# Patient Record
Sex: Male | Born: 1985 | Race: Black or African American | Hispanic: No | Marital: Single | Smoking: Never smoker
Health system: Southern US, Community
[De-identification: ages and names within clinical notes are randomized; demographics above are authoritative.]

## PROBLEM LIST (undated history)

## (undated) HISTORY — PX: HERNIA REPAIR: SHX51

---

## 2007-03-18 ENCOUNTER — Emergency Department (HOSPITAL_COMMUNITY): Admission: EM | Admit: 2007-03-18 | Discharge: 2007-03-18 | Payer: Self-pay | Admitting: Emergency Medicine

## 2007-11-20 ENCOUNTER — Ambulatory Visit (HOSPITAL_COMMUNITY): Admission: RE | Admit: 2007-11-20 | Discharge: 2007-11-20 | Payer: Self-pay | Admitting: General Surgery

## 2010-04-11 ENCOUNTER — Emergency Department (HOSPITAL_COMMUNITY): Admission: EM | Admit: 2010-04-11 | Discharge: 2010-04-11 | Payer: Self-pay | Admitting: Emergency Medicine

## 2011-04-05 NOTE — Op Note (Signed)
NAME:  Jorge Banks, Jorge Banks             ACCOUNT NO.:  [id number]   MEDICAL RECORD NO.:  [id number]          PATIENT TYPE:  AMB   LOCATION:  DAY                          FACILITY:  [facility]   PHYSICIAN:  Angelia Mould. Derrell Lolling, M.D.DATE OF BIRTH:  [birth year]   DATE OF PROCEDURE:  11/20/2007  DATE OF DISCHARGE:                               OPERATIVE REPORT   PREOPERATIVE DIAGNOSIS:  Bilateral inguinal hernias.   POSTOPERATIVE DIAGNOSIS:  Bilateral inguinal hernias.   OPERATION PERFORMED:  Laparoscopic, preperitoneal repair of bilateral  inguinal hernias with mesh.   SURGEON:  Dr. Claud Kelp   OPERATIVE INDICATIONS:  This is a 26 year old black man who has  developed a bulge and some pain in his right groin for several months.  On exam, he has a large right inguinal hernia that partially extends  into the scrotum.  On exam he also has a smaller left inguinal hernia  that is basically asymptomatic.  He was offered repair of both hernias  and he elected to do that.  We discussed options for intervention and we  decided to go ahead with a laparoscopic approach.   OPERATIVE FINDINGS:  The patient had a very large, indirect right  inguinal hernia but I was able to ultimately completely dissect the  indirect sac completely back and away.  On the left side, he had a  smaller indirect hernia that was more easily dissected.  On the right  side, the internal inguinal ring seemed to be enlarged and on the left  side it did not.   OPERATIVE TECHNIQUE:  Following induction of general endotracheal  anesthesia, the patient's abdomen was prepped and draped in a sterile  fashion.  The patient was identified as the correct patient and the  correct procedure.  Intravenous antibiotics were given.  Then, 0.5%  Marcaine with epinephrine was used as a local infiltration anesthetic.  A curved transverse incision was made at the lower rim of the umbilicus.  The fascia was incised transversely exposing the medial  margin of the  right rectus muscle.  A dissector balloon was placed in the right rectus  sheath in the midline behind the right rectus muscle.  The camera was  inserted.  The dissector balloon was inflated under direct vision with  air and held in place for about 4 or 5 minutes.   Deployment of the balloon was fairly good.  We removed the dissector  balloon.  We secured the trocar following inflation of the trocar  balloon and insufflated 12 mmHg.  We inserted the video camera.  We  found that we had a hole in the peritoneum high on the left side and the  patient had a pneumoperitoneum.  We placed a Veress needle in the right  upper quadrant to decompress that somewhat.  We placed a 5-mm trocar in  the midline just below the video camera, and ultimately after dissecting  we had a trocar in the left lower quadrant and the right lower quadrant,  both 5 mm.   We turned our attention to the right side first.  We noticed that there  was  a huge indirect hernia.  We began to dissect this away.  We  ultimately had to divide the inferior epigastric vessels because they  were stripped down and away and intimately associated with the hernia  sac.  Ultimately, I was able to get around the lateral aspect of the  indirect hernia sac and encircle and get behind the indirect hernia sac  and the vas deferens and the cord structures.  It took some time but I  slowly dissected the indirect sac away from the cord structures and out  of the inguinal canal until I had the sac completely freed up and to  where I could pull it well back and above the level of the anterior  superior iliac spine.  I checked the anatomy and could see the vas  deferens and the cord structures and the internal ring.   I then cleaned off Cooper's ligaments on both sides and the pubic  tubercle a little bit.  I then turned my attention to the left side  where I cleaned off the peritoneum laterally and then from lateral to  medially  isolated and encircled the cord structures.  I dissected the  indirect hernia sac on the left side back easily to where it was well  above the level of the anterior superior iliac spine.  I could see that  there was nothing left but the vas deferens and the testicular vessels.  There was no evidence of direct hernia on either side.   The hernias were repaired with Bard brand 3-D Max mesh.  I repaired the  right side first.  I slit the mesh laterally so as to wrap around the  cord structures and then inserted the mesh.  I positioned the mesh so  that it would overlap the midline a little bit, overlap the Cooper's  ligament inferiorly a little bit, and then the inferior tail of the mesh  was passed behind the cord structures and everything was positioned.  I  secured the mesh to this midline and the superior rim of Cooper's  ligaments with about four 5 mm screw tacks.  The mesh was likewise  secured up the midline across the posterior belly of the rectus muscle.  Laterally, I overlapped the tails of the mesh.  I then secured the mesh  laterally with 5 mm screw tacker.  Laterally, I made sure that I could  palpate the tacker through the abdominal wall to avoid fixation below  the inguinal ligament and iliopubic tract.   On the left side, I used a large piece of Bard 3-D Max mesh.  On the  left side, I did not slit the mesh laterally but simply inserted it and  positioned and tacked it in place.  I overlapped the midline a little  bit.  I tacked it down on Cooper's ligament with about three screw  tacks, tacked it up the midline and across the posterior belly of the  rectus muscle.  Laterally, I secured the mesh in a similar fashion above  the level of the iliopubic tract by palpation of the tacker through the  abdominal wall.   I examined the repairs.  Both repairs appeared secure and well fixed.  There was no bleeding.  I irrigated out the field a little bit.  Everything looked fine.   The trocars were removed under direct vision.  There was no bleeding from the trocar sites.  Pneumoperitoneum was  released.  The fascia at the umbilicus was  closed with two interrupted  figure-of-eight sutures of 0 Vicryl.  Skin incisions were closed with  subcuticular sutures of 4-0 Monocryl and Dermabond.  Clean bandages were  placed and the patient taken to the recovery room in stable condition.  Estimated blood loss was about 28 mL.  Complications none.  Sponge,  needle and instrument counts were correct.      Angelia Mould. Derrell Lolling, M.D.  Electronically Signed     HMI/MEDQ  D:  11/20/2007  T:  11/20/2007  Job:  [id number]   cc:   Jocelyn Lamer D. Pecola Leisure, M.D.  Fax: [phone]

## 2011-08-26 LAB — COMPREHENSIVE METABOLIC PANEL
ALT: 32
Albumin: 4.2
Alkaline Phosphatase: 62
CO2: 28
Calcium: 9.7
GFR calc Af Amer: >60
Total Bilirubin: 0.6
Total Protein: 7.5

## 2011-08-26 LAB — URINALYSIS, ROUTINE W REFLEX MICROSCOPIC
Hgb urine dipstick: NEGATIVE
Protein, ur: NEGATIVE
Specific Gravity, Urine: 1.025
Urobilinogen, UA: 1

## 2011-08-26 LAB — DIFFERENTIAL
Basophils Relative: 0
Eosinophils Absolute: 0.1
Eosinophils Relative: 1
Lymphs Abs: 1.9
Monocytes Absolute: 0.6
Monocytes Relative: 7

## 2011-08-26 LAB — CBC
HCT: 49.2
Hemoglobin: 17
MCV: 82.8
RBC: 5.94 — ABNORMAL HIGH
RDW: 12.2

## 2011-08-31 IMAGING — CR DG SHOULDER 2+V*R*
3 series · 3 of 3 positions shown · non-contrast
Comparison: None.

CLINICAL DATA: Right shoulder pain.  Shoulder dislocation 1 month
ago by history.

RIGHT SHOULDER - 2+ VIEW

[w shoulder ap internal right]
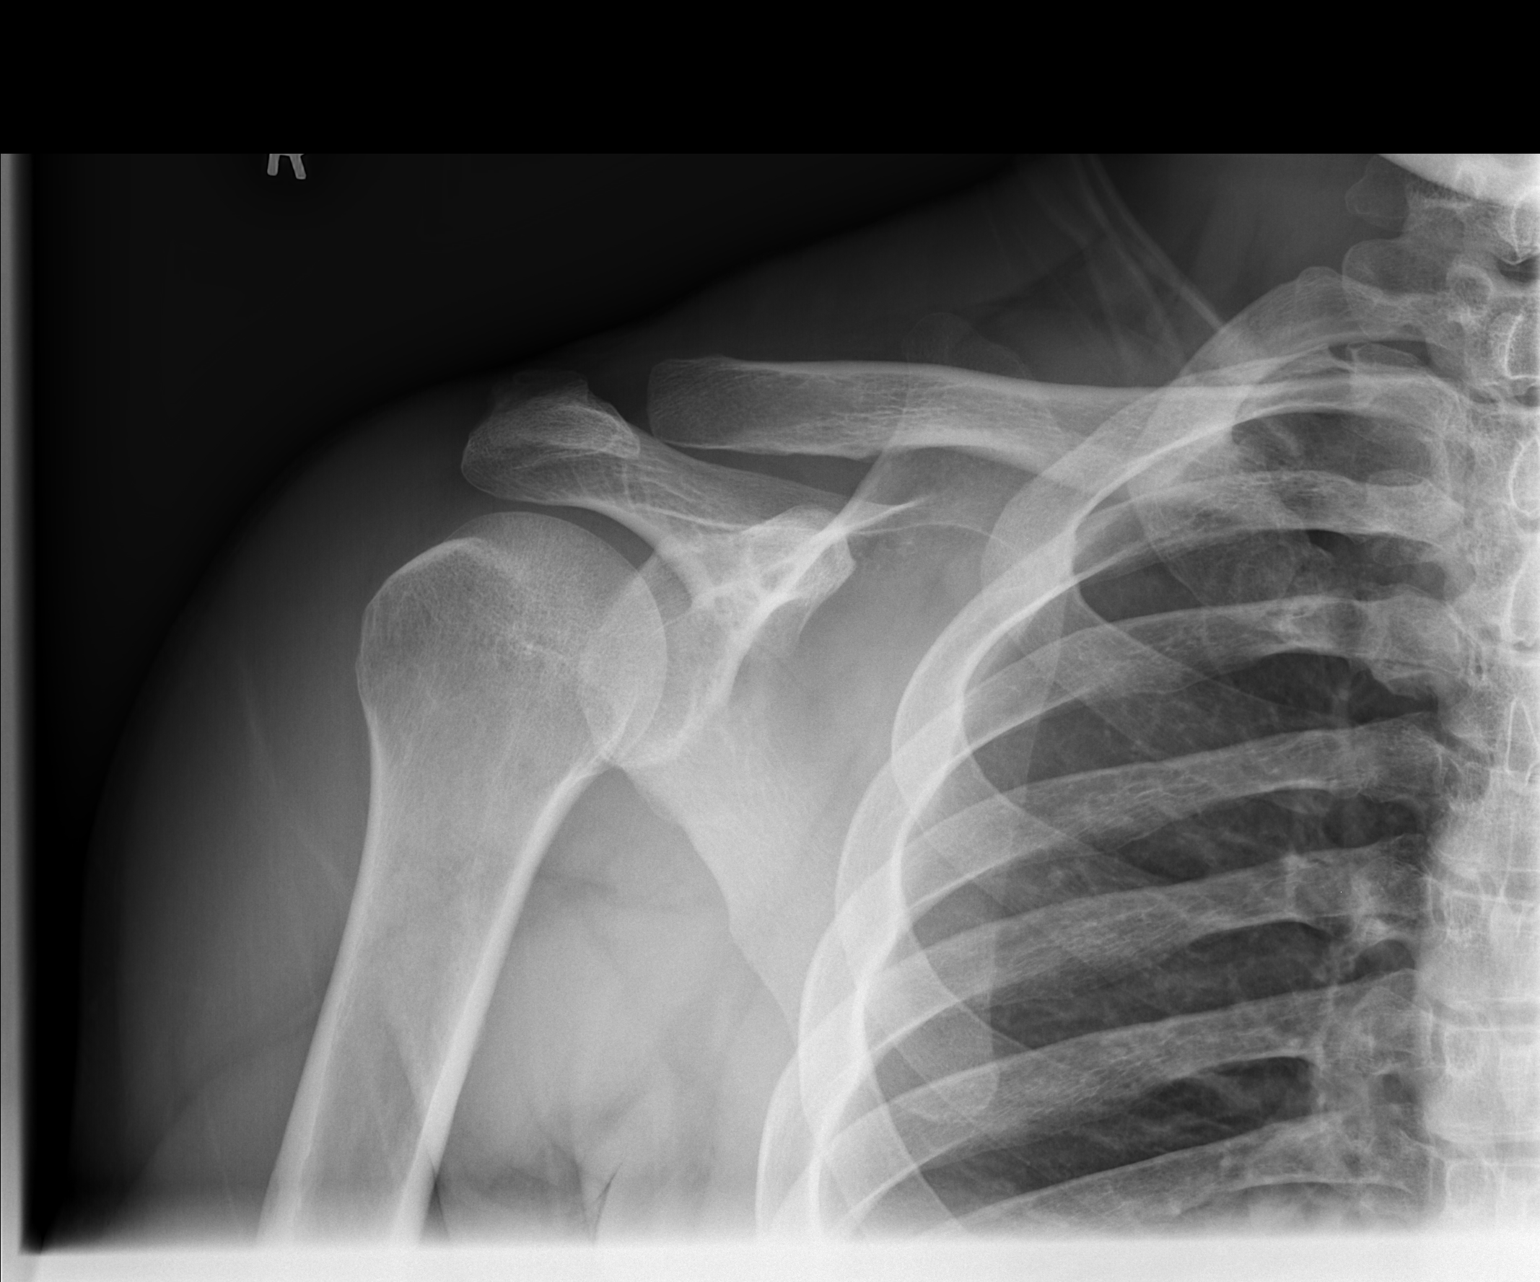

[w shoulder ap external right]
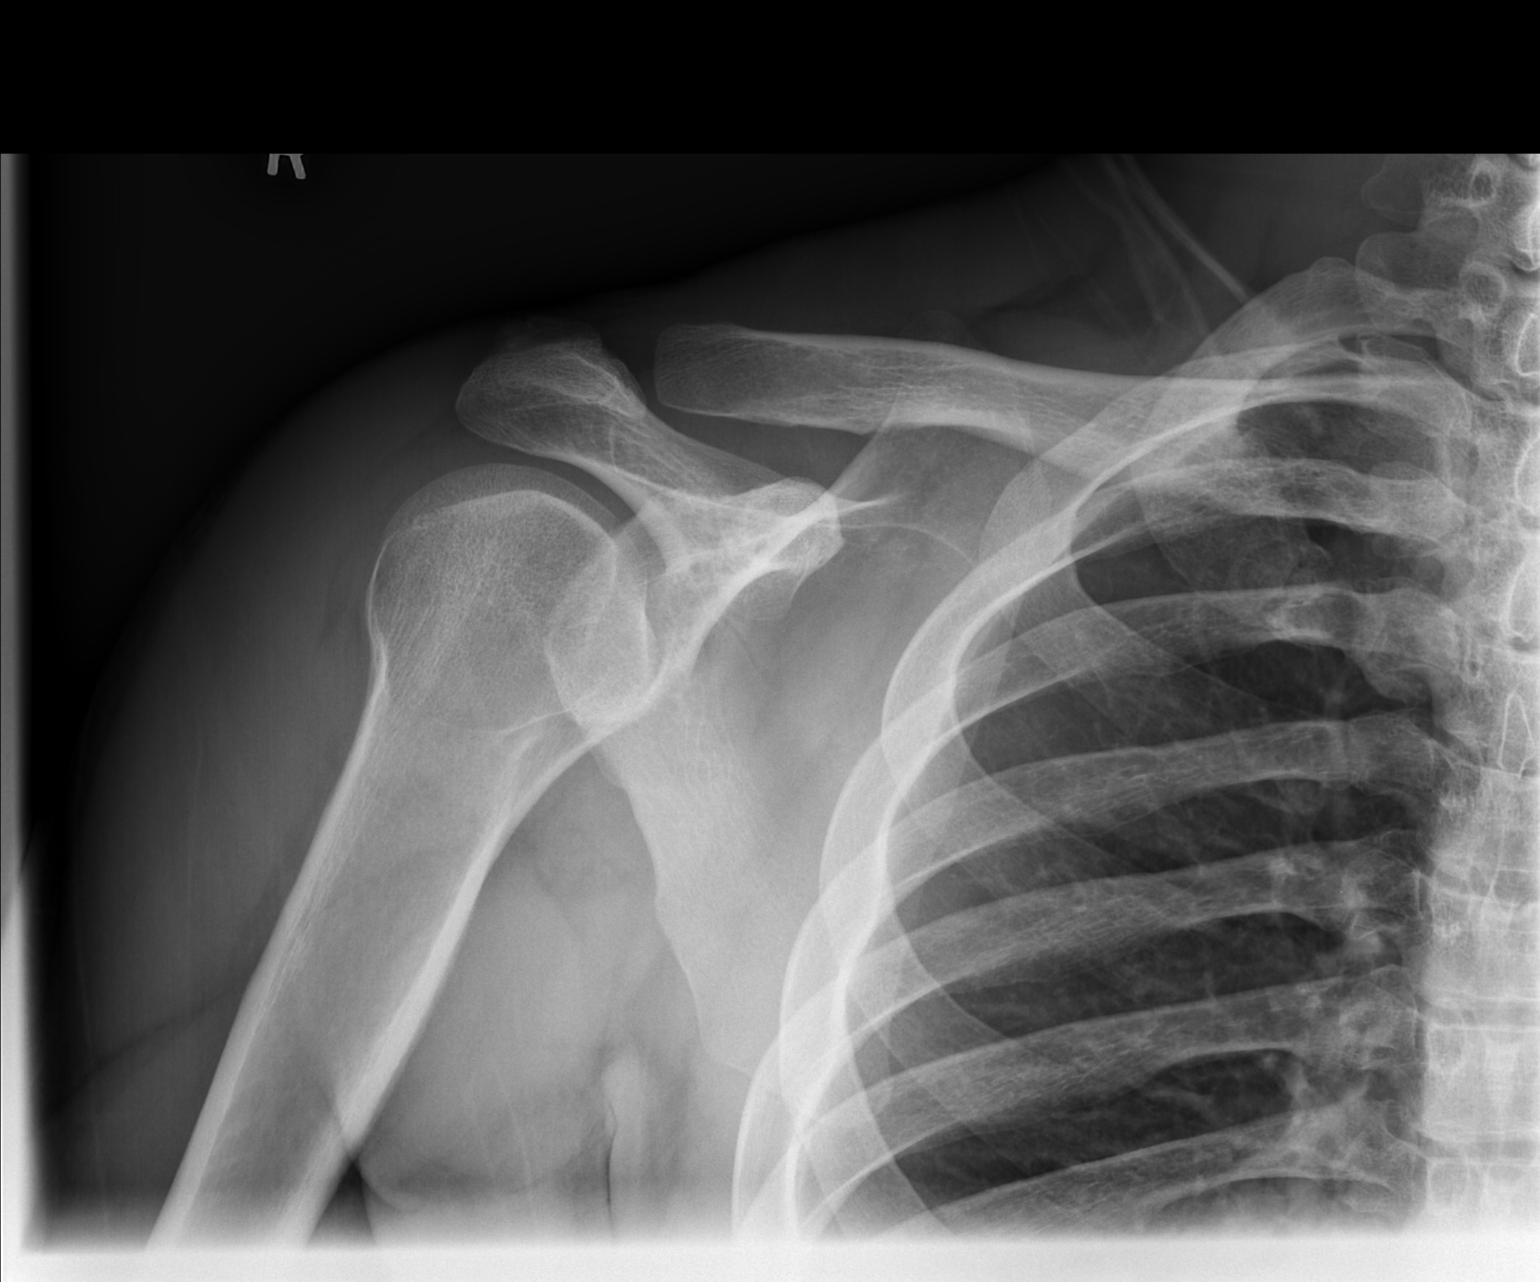

[w shoulder y view right]
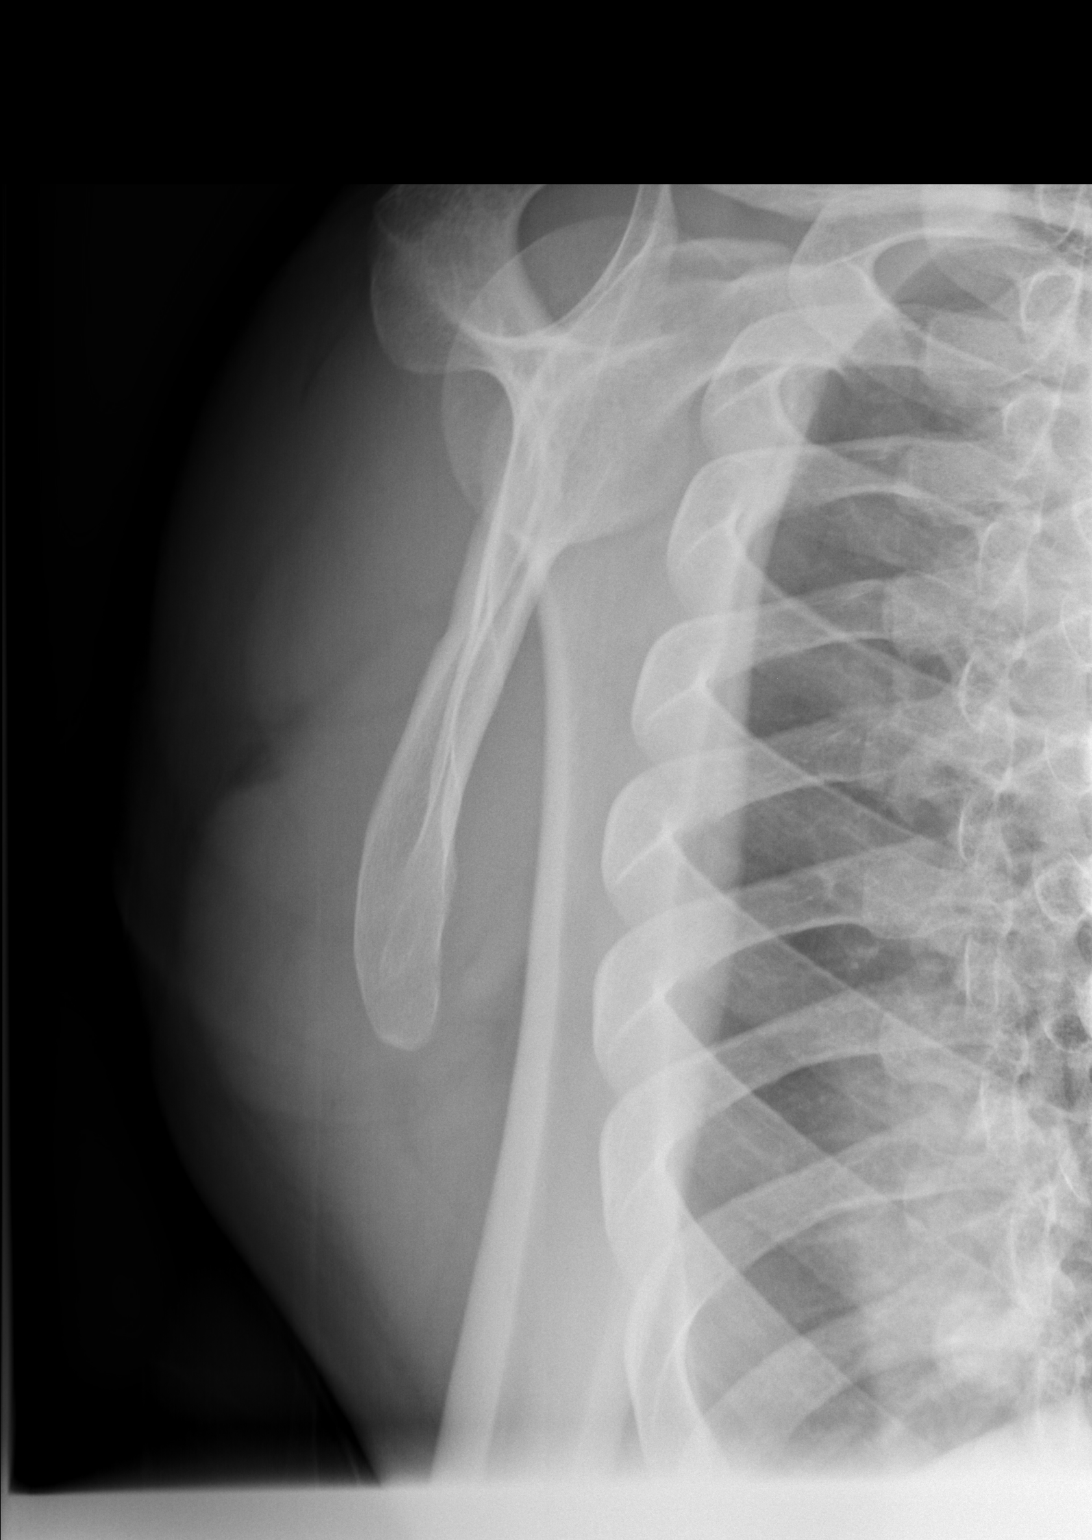

[3 of 3 positions shown; findings below may reference images not displayed]

FINDINGS: Normal appearing bones and soft tissues.
IMPRESSION: Normal examination.

## 2013-12-19 ENCOUNTER — Emergency Department (HOSPITAL_COMMUNITY)
Admission: EM | Admit: 2013-12-19 | Discharge: 2013-12-19 | Disposition: A | Discharge status: Home / Self Care | Payer: BC Managed Care – PPO | Attending: Emergency Medicine | Admitting: Emergency Medicine

## 2013-12-19 ENCOUNTER — Encounter (HOSPITAL_COMMUNITY): Payer: Self-pay | Admitting: Emergency Medicine

## 2013-12-19 DIAGNOSIS — H6120 Impacted cerumen, unspecified ear: Secondary | ICD-10-CM

## 2013-12-19 DIAGNOSIS — H9210 Otorrhea, unspecified ear: Secondary | ICD-10-CM | POA: Insufficient documentation

## 2013-12-19 DIAGNOSIS — H9202 Otalgia, left ear: Secondary | ICD-10-CM

## 2013-12-19 MED ORDER — ANTIPYRINE-BENZOCAINE 5.4-1.4 % OT SOLN: 3.0000 [drp] | OTIC | Status: AC | PRN

## 2013-12-19 NOTE — ED Notes (Signed)
Left ear flushed with peroxide several times Approximately 3 ml of peroxide left in ear for about 30 minutes Left ear then flushed copiously with warm water, resulting in only small flecks of dark cerumen to be removed This nurse then attempted to further dislodge cerumen  Patient asking for procedure to stop r/t pain--unable to advance curette more than a few cm Will make PA aware

## 2013-12-19 NOTE — ED Notes (Signed)
Pt c/o L ear pain and drainage x 24 hours. Pt attempted to use ear wax remover last night without relief.

## 2013-12-19 NOTE — ED Provider Notes (Signed)
CSN: [id number]     Arrival date & time 12/19/13  0007 History   First MD Initiated Contact with Patient 12/19/13 [phone]     Chief Complaint  Patient presents with  . Ear Fullness   (Consider location/radiation/quality/duration/timing/severity/associated sxs/prior Treatment) HPI Comments: L otalgia x 24 hours. Patient denies fever, ear swelling, ear redness, bloody drainage from ear, tinnitus, and hearing loss as well as neck pain or stiffness.  Patient is a 28 y.o. male presenting with plugged ear sensation. The history is provided by the patient. No language interpreter was used.  Ear Fullness This is a new problem. The current episode started yesterday. The problem occurs constantly. The problem has been waxing and waning. Pertinent negatives include no chills, congestion, coughing, fatigue, fever, headaches, nausea, neck pain, sore throat, vertigo or vomiting. Nothing aggravates the symptoms. Treatments tried: OTC earwax remover and Q-tips. The treatment provided no relief.    History reviewed. No pertinent past medical history. Past Surgical History  Procedure Laterality Date  . Hernia repair     No family history on file. History  Substance Use Topics  . Smoking status: Never Smoker   . Smokeless tobacco: Not on file  . Alcohol Use: Yes     Comment: social    Review of Systems  Constitutional: Negative for fever, chills and fatigue.  HENT: Positive for ear discharge (waxy) and ear pain. Negative for congestion, facial swelling and sore throat.   Respiratory: Negative for cough.   Gastrointestinal: Negative for nausea and vomiting.  Musculoskeletal: Negative for neck pain and neck stiffness.  Neurological: Negative for dizziness, vertigo, light-headedness and headaches.  All other systems reviewed and are negative.    Allergies  Review of patient's allergies indicates no known allergies.  Home Medications   Current Outpatient Rx  Name  Route  Sig  Dispense  Refill  .  pseudoephedrine-guaifenesin (MUCINEX D) 60-600 MG per tablet   Oral   Take 1 tablet by mouth every 12 (twelve) hours.         Marland Kitchen. antipyrine-benzocaine (AURALGAN) otic solution   Both Ears   Place 3-4 drops into both ears every 2 (two) hours as needed for ear pain.   10 mL   0    BP 126/71  Pulse 76  Temp(Src) 97.9 F (36.6 C) (Oral)  Resp 17  Wt 215 lb (97.523 kg)  SpO2 99%  Physical Exam  Nursing note and vitals reviewed. Constitutional: He is oriented to person, place, and time. He appears well-developed and well-nourished. No distress.  HENT:  Head: Normocephalic and atraumatic.  Right Ear: Hearing and external ear normal. No drainage, swelling or tenderness. No mastoid tenderness.  Left Ear: Hearing and external ear normal. No drainage, swelling or tenderness. No mastoid tenderness.  Nose: Nose normal.  Mouth/Throat: Uvula is midline, oropharynx is clear and moist and mucous membranes are normal.  No swelling of bilateral external ears. No tenderness when palpating the tragus or when pulling on oracle bilaterally. No mastoid swelling, redness, or tenderness to palpation bilaterally. Physical exam significant for bilateral cerumen impaction.  Eyes: Conjunctivae and EOM are normal. No scleral icterus.  Neck: Normal range of motion.  No nuchal rigidity or meningismus  Pulmonary/Chest: Effort normal. No respiratory distress.  Musculoskeletal: Normal range of motion.  Neurological: He is alert and oriented to person, place, and time.  Skin: Skin is warm and dry. No rash noted. He is not diaphoretic. No erythema. No pallor.  Psychiatric: He has a normal mood  and affect. His behavior is normal.    ED Course  Procedures (including critical care time) Labs Review Labs Reviewed - No data to display Imaging Review No results found.  EKG Interpretation   None       MDM   1. Cerumen impaction   2. Otalgia of left ear    Uncomplicated bilateral cerumen impaction.  Patient well and nontoxic appearing, hemodynamically stable, and afebrile. No mastoid swelling, erythema, or tenderness. No tenderness on palpating the tragus or pulling on oracle bilaterally. No nuchal rigidity or meningismus. Bilateral ear canals attempted to be irrigated multiple times in ED unsuccessfully; unable to dislodge bilateral cerumen plugs. Patient will be discharged with referral to ENT for further evaluation of symptoms. Will prescribe Auralgan for symptomatic management. Return precautions provided. Patient agreeable to plan.   Filed Vitals:   12/19/13 0029 12/19/13 0525  BP: 146/84 126/71  Pulse: 80 76  Temp: 98.3 F (36.8 C) 97.9 F (36.6 C)  TempSrc: Oral Oral  Resp: 18 17  Weight: 215 lb (97.523 kg)   SpO2: 97% 99%       Antony Madura, PA-C 12/19/13 [phone]

## 2013-12-19 NOTE — Discharge Instructions (Signed)
Cerumen Impaction A cerumen impaction is when the wax in your ear forms a plug. This plug usually causes reduced hearing. Sometimes it also causes an earache or dizziness. Removing a cerumen impaction can be difficult and painful. The wax sticks to the ear canal. The canal is sensitive and bleeds easily. If you try to remove a heavy wax buildup with a cotton tipped swab, you may push it in further. Irrigation with water, suction, and small ear curettes may be used to clear out the wax. If the impaction is fixed to the skin in the ear canal, ear drops may be needed for a few days to loosen the wax. People who build up a lot of wax frequently can use ear wax removal products available in your local drugstore. SEEK MEDICAL CARE IF:  You develop an earache, increased hearing loss, or marked dizziness. Document Released: 12/15/2004 Document Revised: 01/30/2012 Document Reviewed: 02/04/2010 [facility] LLCExitCare Patient Information 2014 [place], [place].  Draining Ear Ear wax, pus, blood and other fluids are examples of the different types of drainage from ears. Drops or cream may be needed to lessen the itching which may occur with ear drainage. CAUSES   Skin irritations in the ear.  Ear infection.  Swimmer's ear.  Ruptured eardrum.  Foreign object in the ear canal.  Sudden pressure changes.  Head injury. HOME CARE INSTRUCTIONS   Only take over-the-counter or prescription medicines for pain, fever, or discomfort as directed by your caregiver.  Do not rub the ear canal with cotton-tipped swabs.  Do not swim until your caregiver says it is okay.  Before you take a shower, cover a cotton ball with petroleum jelly to keep water out.  Limit exposure to smoke. Secondhand smoke can increase the chance for ear infections.  Keep up with immunizations.  Wash your hands well.  Keep all follow-up appointments to examine the ear and evaluate hearing. SEEK MEDICAL CARE IF:   You have increased  drainage.  You have ear pain, a fever, or drainage that is not getting better after 48 hours of antibiotics.  You are unusually tired. SEEK IMMEDIATE MEDICAL CARE IF:  You have severe ear pain or headache.  The patient is older than 3 months with a rectal or oral temperature of 102 F (38.9 C) or higher.  The patient is 763 months old or younger with a rectal temperature of 100.4 F (38 C) or higher.  You vomit.  You feel dizzy.  You have a seizure.  You have new hearing loss. MAKE SURE YOU:   Understand these instructions.  Will watch your condition.  Will get help right away if you are not doing well or get worse. Document Released: 11/07/2005 Document Revised: 01/30/2012 Document Reviewed: 09/10/2009 [facility] Patient Information 2014 [place], [place].

## 2013-12-19 NOTE — ED Provider Notes (Signed)
Medical screening examination/treatment/procedure(s) were performed by non-physician practitioner and as supervising physician I was immediately available for consultation/collaboration.    Tajha Sammarco M Darcel Zick, MD 12/19/13 0629 

## 2019-09-23 ENCOUNTER — Other Ambulatory Visit: Payer: Self-pay

## 2019-09-23 DIAGNOSIS — Z20822 Contact with and (suspected) exposure to covid-19: Secondary | ICD-10-CM

## 2019-09-24 LAB — NOVEL CORONAVIRUS, NAA: SARS-CoV-2, NAA: NOT DETECTED

## 2019-09-25 ENCOUNTER — Telehealth: Payer: Self-pay | Admitting: General Practice

## 2019-09-25 NOTE — Telephone Encounter (Signed)
Negative COVID results given. Patient results "NOT Detected." Caller expressed understanding. ° °

## 2021-01-12 ENCOUNTER — Other Ambulatory Visit: Payer: Self-pay

## 2021-01-12 ENCOUNTER — Emergency Department (HOSPITAL_BASED_OUTPATIENT_CLINIC_OR_DEPARTMENT_OTHER)
Admission: EM | Admit: 2021-01-12 | Discharge: 2021-01-12 | Disposition: A | Discharge status: Home / Self Care | Attending: Emergency Medicine | Admitting: Emergency Medicine

## 2021-01-12 ENCOUNTER — Encounter (HOSPITAL_BASED_OUTPATIENT_CLINIC_OR_DEPARTMENT_OTHER): Payer: Self-pay

## 2021-01-12 DIAGNOSIS — Y990 Civilian activity done for income or pay: Secondary | ICD-10-CM | POA: Diagnosis not present

## 2021-01-12 DIAGNOSIS — Z23 Encounter for immunization: Secondary | ICD-10-CM | POA: Diagnosis not present

## 2021-01-12 DIAGNOSIS — S01112A Laceration without foreign body of left eyelid and periocular area, initial encounter: Secondary | ICD-10-CM | POA: Diagnosis not present

## 2021-01-12 DIAGNOSIS — S01511A Laceration without foreign body of lip, initial encounter: Secondary | ICD-10-CM | POA: Insufficient documentation

## 2021-01-12 DIAGNOSIS — S0993XA Unspecified injury of face, initial encounter: Secondary | ICD-10-CM | POA: Diagnosis present

## 2021-01-12 DIAGNOSIS — S0083XA Contusion of other part of head, initial encounter: Secondary | ICD-10-CM

## 2021-01-12 MED ORDER — TETANUS-DIPHTH-ACELL PERTUSSIS 5-2.5-18.5 LF-MCG/0.5 IM SUSY
0.5000 mL | PREFILLED_SYRINGE | Freq: Once | INTRAMUSCULAR | Status: AC
Start: 2021-01-12 — End: 2021-01-12
  Administered 2021-01-12: 0.5 mL via INTRAMUSCULAR
  Filled 2021-01-12: qty 0.5

## 2021-01-12 MED ORDER — IBUPROFEN 400 MG PO TABS
600.0000 mg | ORAL_TABLET | Freq: Once | ORAL | Status: AC
  Administered 2021-01-12: 600 mg via ORAL
  Filled 2021-01-12: qty 1

## 2021-01-12 MED ORDER — LIDOCAINE-EPINEPHRINE (PF) 2 %-1:200000 IJ SOLN
5.0000 mL | Freq: Once | INTRAMUSCULAR | Status: AC
  Administered 2021-01-12: 5 mL
  Filled 2021-01-12: qty 20
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
## 2021-01-12 NOTE — Discharge Instructions (Addendum)
Please read instructions below.  Keep your wound clean and covered. In 24 hours, you can get your wound wet; gently clean it with soap and water, pat it dry, and reapply a clean bandage. You can take ibuprofen/advil as needed for pain Apply ice for 15 minutes at a time to help with pain and swelling. Follow up with your primary care or urgent care for wound recheck and suture removal in 5 days days.  Avoid any contact sports or activities if you continue to have headache, as a concussion precaution.  Your primary care occupational health can return you back to normal activity. Return to the emergency department if you develop severe headache, vision changes, unstable balance, vomiting, or other concerning symptoms Return to the ER for fever, pus draining from wound, redness, or new or worsening symptoms.
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
